# Patient Record
Sex: Male | Born: 1965 | Race: White | Hispanic: No | Marital: Single | Smoking: Never smoker
Health system: Southern US, Community
[De-identification: ages and names within clinical notes are randomized; demographics above are authoritative.]

## PROBLEM LIST (undated history)

## (undated) DIAGNOSIS — I10 Essential (primary) hypertension: Secondary | ICD-10-CM

---

## 2013-07-27 ENCOUNTER — Emergency Department (INDEPENDENT_AMBULATORY_CARE_PROVIDER_SITE_OTHER)
Admission: EM | Admit: 2013-07-27 | Discharge: 2013-07-27 | Disposition: A | Discharge status: Home / Self Care | Payer: Self-pay | Source: Home / Self Care | Attending: Emergency Medicine | Admitting: Emergency Medicine

## 2013-07-27 ENCOUNTER — Encounter (HOSPITAL_COMMUNITY): Payer: Self-pay | Admitting: Emergency Medicine

## 2013-07-27 DIAGNOSIS — R030 Elevated blood-pressure reading, without diagnosis of hypertension: Secondary | ICD-10-CM

## 2013-07-27 DIAGNOSIS — K029 Dental caries, unspecified: Secondary | ICD-10-CM

## 2013-07-27 MED ORDER — HYDROCODONE-ACETAMINOPHEN 5-325 MG PO TABS: 1.0000 | ORAL_TABLET | Freq: Four times a day (QID) | ORAL | Status: AC | PRN

## 2013-07-27 MED ORDER — LIDOCAINE VISCOUS 2 % MT SOLN: OROMUCOSAL | Status: AC

## 2013-07-27 MED ORDER — PENICILLIN V POTASSIUM 500 MG PO TABS: 500.0000 mg | ORAL_TABLET | Freq: Three times a day (TID) | ORAL | Status: AC

## 2013-07-27 NOTE — ED Provider Notes (Signed)
CSN: [id number]     Arrival date & time 07/27/13  1221 History   First MD Initiated Contact with Patient 07/27/13 1542     Chief Complaint  Patient presents with  . Dental Pain   (Consider location/radiation/quality/duration/timing/severity/associated sxs/prior Treatment) Patient is a 47 y.o. male presenting with tooth pain. The history is provided by the patient.  Dental Pain Location:  Upper Upper teeth location:  1/RU 3rd molar Quality:  Aching Severity:  Moderate Onset quality:  Gradual Duration:  2 days Timing:  Constant Chronicity:  Recurrent Context: dental fracture   Context comment:  States tooth broke about 3-4 months ago.  Previous work-up:  Filled cavity Ineffective treatments:  NSAIDs Associated symptoms: no difficulty swallowing, no drooling, no facial pain, no facial swelling, no fever, no gum swelling, no headaches, no neck pain, no neck swelling, no oral bleeding, no oral lesions and no trismus   Risk factors: lack of dental care and smoking     History reviewed. No pertinent past medical history. History reviewed. No pertinent past surgical history. No family history on file. History  Substance Use Topics  . Smoking status: Never Smoker   . Smokeless tobacco: Not on file  . Alcohol Use: No    Review of Systems  Constitutional: Negative for fever.  HENT: Negative for drooling, facial swelling and mouth sores.   Musculoskeletal: Negative for neck pain.  Neurological: Negative for headaches.  All other systems reviewed and are negative.    Allergies  Review of patient's allergies indicates no known allergies.  Home Medications  No current outpatient prescriptions on file. BP 142/92  Pulse 60  Temp(Src) 97.3 F (36.3 C) (Oral)  Resp 18  SpO2 99% Physical Exam  Nursing note and vitals reviewed. Constitutional: He is oriented to person, place, and time. He appears well-nourished. No distress.  HENT:  Head: Normocephalic and atraumatic.  Right  Ear: Hearing, tympanic membrane, external ear and ear canal normal.  Nose: Nose normal.  Mouth/Throat: Oropharynx is clear and moist and mucous membranes are normal. He does not have dentures. No oral lesions. No trismus in the jaw. Dental caries present. No dental abscesses, uvula swelling or lacerations.    Eyes: Conjunctivae are normal.  Neck: Normal range of motion. Neck supple.  Cardiovascular: Normal rate.   Pulmonary/Chest: Effort normal.  Abdominal: There is no tenderness.  Lymphadenopathy:    He has no cervical adenopathy.  Neurological: He is alert and oriented to person, place, and time.  Skin: Skin is warm and dry.  Psychiatric: He has a normal mood and affect. His behavior is normal.    ED Course  Procedures (including critical care time) Labs Review Labs Reviewed - No data to display Imaging Review No results found.  EKG Interpretation    Date/Time:    Ventricular Rate:    PR Interval:    QRS Duration:   QT Interval:    QTC Calculation:   R Axis:     Text Interpretation:              MDM  Patient provided information for local dentist for follow up. Rx for viscous lidocaine, hydrocodone and PCN to utilize until dental follow up. No clinical evidence of dental abscess.     Jess Barters [place], [place] 07/27/13 [phone]

## 2013-07-27 NOTE — ED Notes (Signed)
Pt c/o dental pain onset yest... Reports she broke his tooth about 2 months ago Denies: swelling, f/v/n/d He is alert w/no signs of acute distress.

## 2013-07-27 NOTE — ED Provider Notes (Signed)
Medical screening examination/treatment/procedure(s) were performed by non-physician practitioner and as supervising physician I was immediately available for consultation/collaboration.  Leslee Home, M.D.  Reuben Likes, MD 07/27/13 2216

## 2019-09-29 ENCOUNTER — Emergency Department (HOSPITAL_COMMUNITY): Payer: No Typology Code available for payment source

## 2019-09-29 ENCOUNTER — Other Ambulatory Visit: Payer: Self-pay

## 2019-09-29 ENCOUNTER — Emergency Department (HOSPITAL_COMMUNITY)
Admission: EM | Admit: 2019-09-29 | Discharge: 2019-09-29 | Disposition: A | Discharge status: Home / Self Care | Payer: No Typology Code available for payment source | Attending: Emergency Medicine | Admitting: Emergency Medicine

## 2019-09-29 ENCOUNTER — Encounter (HOSPITAL_COMMUNITY): Payer: Self-pay | Admitting: Emergency Medicine

## 2019-09-29 DIAGNOSIS — Y9389 Activity, other specified: Secondary | ICD-10-CM | POA: Diagnosis not present

## 2019-09-29 DIAGNOSIS — S62346A Nondisplaced fracture of base of fifth metacarpal bone, right hand, initial encounter for closed fracture: Secondary | ICD-10-CM | POA: Diagnosis not present

## 2019-09-29 DIAGNOSIS — Y92410 Unspecified street and highway as the place of occurrence of the external cause: Secondary | ICD-10-CM | POA: Insufficient documentation

## 2019-09-29 DIAGNOSIS — Z79899 Other long term (current) drug therapy: Secondary | ICD-10-CM | POA: Diagnosis not present

## 2019-09-29 DIAGNOSIS — S52571A Other intraarticular fracture of lower end of right radius, initial encounter for closed fracture: Secondary | ICD-10-CM | POA: Insufficient documentation

## 2019-09-29 DIAGNOSIS — Y904 Blood alcohol level of 80-99 mg/100 ml: Secondary | ICD-10-CM | POA: Insufficient documentation

## 2019-09-29 DIAGNOSIS — S60811A Abrasion of right wrist, initial encounter: Secondary | ICD-10-CM | POA: Insufficient documentation

## 2019-09-29 DIAGNOSIS — F1099 Alcohol use, unspecified with unspecified alcohol-induced disorder: Secondary | ICD-10-CM | POA: Insufficient documentation

## 2019-09-29 DIAGNOSIS — Y999 Unspecified external cause status: Secondary | ICD-10-CM | POA: Insufficient documentation

## 2019-09-29 DIAGNOSIS — Z23 Encounter for immunization: Secondary | ICD-10-CM | POA: Insufficient documentation

## 2019-09-29 DIAGNOSIS — S6991XA Unspecified injury of right wrist, hand and finger(s), initial encounter: Secondary | ICD-10-CM | POA: Diagnosis present

## 2019-09-29 LAB — COMPREHENSIVE METABOLIC PANEL
ALT: 22 U/L (ref 0–44)
AST: 30 U/L (ref 15–41)
Albumin: 4.2 g/dL (ref 3.5–5.0)
Alkaline Phosphatase: 82 U/L (ref 38–126)
Anion gap: 10 (ref 5–15)
BUN: 9 mg/dL (ref 6–20)
CO2: 21 mmol/L — ABNORMAL LOW (ref 22–32)
Calcium: 8.8 mg/dL — ABNORMAL LOW (ref 8.9–10.3)
Chloride: 106 mmol/L (ref 98–111)
Creatinine, Ser: 0.86 mg/dL (ref 0.61–1.24)
GFR calc Af Amer: >60 mL/min (ref >60)
GFR calc non Af Amer: >60 mL/min (ref >60)
Glucose, Bld: 121 mg/dL — ABNORMAL HIGH (ref 70–99)
Potassium: 4.3 mmol/L (ref 3.5–5.1)
Sodium: 137 mmol/L (ref 135–145)
Total Bilirubin: 0.5 mg/dL (ref 0.3–1.2)
Total Protein: 6.8 g/dL (ref 6.5–8.1)

## 2019-09-29 LAB — CBC WITH DIFFERENTIAL/PLATELET
Abs Immature Granulocytes: 0.03 10*3/uL (ref 0.00–0.07)
Basophils Absolute: 0.1 10*3/uL (ref 0.0–0.1)
Basophils Relative: 1 %
Eosinophils Absolute: 0 10*3/uL (ref 0.0–0.5)
Eosinophils Relative: 1 %
HCT: 51.5 % (ref 39.0–52.0)
Hemoglobin: 17.2 g/dL — ABNORMAL HIGH (ref 13.0–17.0)
Immature Granulocytes: 0 %
Lymphocytes Relative: 13 %
Lymphs Abs: 1.2 10*3/uL (ref 0.7–4.0)
MCH: 31.9 pg (ref 26.0–34.0)
MCHC: 33.4 g/dL (ref 30.0–36.0)
MCV: 95.5 fL (ref 80.0–100.0)
Monocytes Absolute: 0.4 10*3/uL (ref 0.1–1.0)
Monocytes Relative: 4 %
Neutro Abs: 7.1 10*3/uL (ref 1.7–7.7)
Neutrophils Relative %: 81 %
Platelets: 230 10*3/uL (ref 150–400)
RBC: 5.39 MIL/uL (ref 4.22–5.81)
RDW: 12.5 % (ref 11.5–15.5)
WBC: 8.8 10*3/uL (ref 4.0–10.5)
nRBC: 0 % (ref 0.0–0.2)

## 2019-09-29 LAB — RAPID URINE DRUG SCREEN, HOSP PERFORMED
Amphetamines: NOT DETECTED
Barbiturates: NOT DETECTED
Benzodiazepines: NOT DETECTED
Cocaine: NOT DETECTED
Opiates: NOT DETECTED
Tetrahydrocannabinol: NOT DETECTED

## 2019-09-29 LAB — ETHANOL: Alcohol, Ethyl (B): 98 mg/dL — ABNORMAL HIGH (ref <10)

## 2019-09-29 MED ORDER — BACITRACIN ZINC 500 UNIT/GM EX OINT
TOPICAL_OINTMENT | Freq: Two times a day (BID) | CUTANEOUS | Status: DC
  Administered 2019-09-29: 1 via TOPICAL
  Filled 2019-09-29: qty 0.9

## 2019-09-29 MED ORDER — METHOCARBAMOL 500 MG PO TABS: 500.0000 mg | ORAL_TABLET | Freq: Two times a day (BID) | ORAL | 0 refills | Status: AC

## 2019-09-29 MED ORDER — KETOROLAC TROMETHAMINE 30 MG/ML IJ SOLN
30.0000 mg | Freq: Once | INTRAMUSCULAR | Status: AC
  Administered 2019-09-29: 12:00:00 30 mg via INTRAVENOUS
  Filled 2019-09-29: qty 1

## 2019-09-29 MED ORDER — FENTANYL CITRATE (PF) 100 MCG/2ML IJ SOLN
50.0000 ug | Freq: Once | INTRAMUSCULAR | Status: AC
  Administered 2019-09-29: 09:00:00 50 ug via INTRAVENOUS
  Filled 2019-09-29: qty 2

## 2019-09-29 MED ORDER — HYDROMORPHONE HCL 1 MG/ML IJ SOLN
1.0000 mg | Freq: Once | INTRAMUSCULAR | Status: AC
  Administered 2019-09-29: 11:00:00 1 mg via INTRAVENOUS
  Filled 2019-09-29: qty 1

## 2019-09-29 MED ORDER — FENTANYL CITRATE (PF) 100 MCG/2ML IJ SOLN
50.0000 ug | Freq: Once | INTRAMUSCULAR | Status: AC
  Administered 2019-09-29: 10:00:00 50 ug via INTRAVENOUS
  Filled 2019-09-29: qty 2

## 2019-09-29 MED ORDER — AMOXICILLIN-POT CLAVULANATE 875-125 MG PO TABS
1.0000 | ORAL_TABLET | Freq: Once | ORAL | Status: AC
  Administered 2019-09-29: 10:00:00 1 via ORAL
  Filled 2019-09-29: qty 1

## 2019-09-29 MED ORDER — OXYCODONE-ACETAMINOPHEN 5-325 MG PO TABS
1.0000 | ORAL_TABLET | Freq: Once | ORAL | Status: DC
  Filled 2019-09-29: qty 1

## 2019-09-29 MED ORDER — OXYCODONE-ACETAMINOPHEN 5-325 MG PO TABS: 1.0000 | ORAL_TABLET | Freq: Three times a day (TID) | ORAL | 0 refills | Status: AC | PRN

## 2019-09-29 MED ORDER — AMOXICILLIN-POT CLAVULANATE 875-125 MG PO TABS: 1.0000 | ORAL_TABLET | Freq: Two times a day (BID) | ORAL | 0 refills | Status: AC

## 2019-09-29 MED ORDER — NAPROXEN 500 MG PO TABS: 500.0000 mg | ORAL_TABLET | Freq: Two times a day (BID) | ORAL | 0 refills | Status: AC

## 2019-09-29 MED ORDER — TETANUS-DIPHTH-ACELL PERTUSSIS 5-2.5-18.5 LF-MCG/0.5 IM SUSP
0.5000 mL | Freq: Once | INTRAMUSCULAR | Status: AC
  Administered 2019-09-29: 10:00:00 0.5 mL via INTRAMUSCULAR
  Filled 2019-09-29: qty 0.5

## 2019-09-29 NOTE — Progress Notes (Signed)
Orthopedic Tech Progress Note Patient Details:  Chase Reid [birth year] [id number] MD did a reduction of wrist and wanted to change up the splint. She held and molded I applied the materials.  Ortho Devices Type of Ortho Device: Ulna gutter splint, Volar splint Ortho Device/Splint Location: RUE Ortho Device/Splint Interventions: Ordered, Application   Post Interventions Patient Tolerated: Well Instructions Provided: Care of device, Adjustment of device   Donald Pore 09/29/2019, 11:45 AM

## 2019-09-29 NOTE — ED Notes (Signed)
Patient verbalizes understanding of discharge instructions. Opportunity for questioning and answers were provided. Armband removed by staff, pt discharged from ED.  
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
## 2019-09-29 NOTE — Progress Notes (Signed)
Orthopedic Tech Progress Note Patient Details:  Chase Reid Jun 05, 1966 [id number] I told the PA that patient was still having discomfort. Told them to call if I needed to change the splint Ortho Devices Type of Ortho Device: Ulna gutter splint Ortho Device/Splint Location: RUE Ortho Device/Splint Interventions: Ordered, Application   Post Interventions Patient Tolerated: Fair, Poor Instructions Provided: Care of device   Chase Reid 09/29/2019, 10:31 AM
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
## 2023-09-16 ENCOUNTER — Other Ambulatory Visit: Payer: Self-pay

## 2023-09-16 ENCOUNTER — Emergency Department: Payer: Self-pay

## 2023-09-16 DIAGNOSIS — R202 Paresthesia of skin: Secondary | ICD-10-CM | POA: Insufficient documentation

## 2023-09-16 DIAGNOSIS — R079 Chest pain, unspecified: Secondary | ICD-10-CM | POA: Insufficient documentation

## 2023-09-16 DIAGNOSIS — Z5321 Procedure and treatment not carried out due to patient leaving prior to being seen by health care provider: Secondary | ICD-10-CM | POA: Insufficient documentation

## 2023-09-16 LAB — BASIC METABOLIC PANEL
Anion gap: 14 (ref 5–15)
BUN: 13 mg/dL (ref 6–20)
CO2: 21 mmol/L — ABNORMAL LOW (ref 22–32)
Calcium: 9.1 mg/dL (ref 8.9–10.3)
Chloride: 103 mmol/L (ref 98–111)
Creatinine, Ser: 0.93 mg/dL (ref 0.61–1.24)
GFR, Estimated: >60 mL/min (ref >60)
Glucose, Bld: 138 mg/dL — ABNORMAL HIGH (ref 70–99)
Potassium: 3.8 mmol/L (ref 3.5–5.1)
Sodium: 138 mmol/L (ref 135–145)

## 2023-09-16 LAB — CBC
HCT: 40 % (ref 39.0–52.0)
Hemoglobin: 14.1 g/dL (ref 13.0–17.0)
MCH: 32.2 pg (ref 26.0–34.0)
MCHC: 35.3 g/dL (ref 30.0–36.0)
MCV: 91.3 fL (ref 80.0–100.0)
Platelets: 211 10*3/uL (ref 150–400)
RBC: 4.38 MIL/uL (ref 4.22–5.81)
RDW: 12.1 % (ref 11.5–15.5)
WBC: 8 10*3/uL (ref 4.0–10.5)
nRBC: 0 % (ref 0.0–0.2)

## 2023-09-16 LAB — TROPONIN I (HIGH SENSITIVITY): Troponin I (High Sensitivity): 5 ng/L (ref <18)

## 2023-09-16 NOTE — ED Triage Notes (Signed)
Pt sts that he has been having arm and leg numbness with chest pain for the last several weeks.

## 2023-09-16 NOTE — ED Provider Triage Note (Signed)
Emergency Medicine Provider Triage Evaluation Note  Donell Tomkins , a 58 y.o. male  was evaluated in triage.  Pt complains of patient here for chest pain with associated left arm and leg numbness and paresthesias for several weeks.  Denies any injury.  No history of CVA or ACS.  Currently no chest pain..  Review of Systems  Positive:  Negative:   Physical Exam  BP (!) 165/108 (BP Location: Right Arm)   Pulse 86   Temp 98.1 F (36.7 C) (Oral)   Resp 17   Ht 5\' 10"  (1.778 m)   Wt 77.1 kg   SpO2 96%   BMI 24.39 kg/m  Gen:   Awake, no distress   Resp:  Normal effort  MSK:   Moves extremities without difficulty  Other:    Medical Decision Making  Medically screening exam initiated at 3:16 PM.  Appropriate orders placed.  Corbett Moulder was informed that the remainder of the evaluation will be completed by another provider, this initial triage assessment does not replace that evaluation, and the importance of remaining in the ED until their evaluation is complete.  Cervical and lumbar x-ray and cardiac workup   Christen Bame, PA-C 09/16/23 1610

## 2023-09-17 ENCOUNTER — Emergency Department
Admission: EM | Admit: 2023-09-17 | Discharge: 2023-09-17 | Disposition: A | Discharge status: Home / Self Care | Payer: Self-pay | Attending: Emergency Medicine | Admitting: Emergency Medicine

## 2023-09-17 HISTORY — DX: Essential (primary) hypertension: I10
